# Patient Record
Sex: Female | Born: 1995
Health system: Southern US, Community
[De-identification: ages and names within clinical notes are randomized; demographics above are authoritative.]

---
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
## 2014-09-21 ENCOUNTER — Other Ambulatory Visit: Payer: Self-pay | Admitting: Family Medicine

## 2016-02-11 ENCOUNTER — Emergency Department
Admission: EM | Admit: 2016-02-11 | Discharge: 2016-02-11 | Disposition: A | Discharge status: Home / Self Care | Payer: BLUE CROSS/BLUE SHIELD | Source: Home / Self Care | Attending: Family Medicine | Admitting: Family Medicine

## 2016-02-11 ENCOUNTER — Encounter: Payer: Self-pay | Admitting: *Deleted

## 2016-02-11 ENCOUNTER — Emergency Department (INDEPENDENT_AMBULATORY_CARE_PROVIDER_SITE_OTHER): Payer: BLUE CROSS/BLUE SHIELD

## 2016-02-11 DIAGNOSIS — R05 Cough: Secondary | ICD-10-CM

## 2016-02-11 DIAGNOSIS — R053 Chronic cough: Secondary | ICD-10-CM

## 2016-02-11 MED ORDER — DOXYCYCLINE HYCLATE 100 MG PO CAPS: 100.0000 mg | ORAL_CAPSULE | Freq: Two times a day (BID) | ORAL | 0 refills | Status: AC

## 2016-02-11 MED ORDER — PREDNISONE 20 MG PO TABS: ORAL_TABLET | ORAL | 0 refills | Status: AC

## 2016-02-11 MED ORDER — FLUCONAZOLE 150 MG PO TABS: 150.0000 mg | ORAL_TABLET | Freq: Once | ORAL | 0 refills | Status: AC

## 2016-02-11 NOTE — ED Triage Notes (Signed)
Pt c/o productive cough and congestion x 2 mths. Denies fever. Has taken Mucinex.

## 2016-02-11 NOTE — ED Provider Notes (Signed)
[facility]    CSN: [id number] Arrival date & time: 02/11/16  1059     History   Chief Complaint Chief Complaint  Patient presents with  . Cough    HPI Leslie Bruce is a 20 y.o. female.   Patient complains of a persistent non-productive cough for about two months.  Her symptoms have become worse during the past week with hoarseness and increased nasal congestion.  She denies fever, sore throat, pleuritic pain. She has a family history of asthma (maternal grandmother and first cousin)   The history is provided by the patient and a parent.    History reviewed. No pertinent past medical history.  There are no active problems to display for this patient.   History reviewed. No pertinent surgical history.  OB History    No data available       Home Medications    Prior to Admission medications   Medication Sig Start Date End Date Taking? Authorizing Provider  FLUoxetine (PROZAC) 10 MG tablet Take 10 mg by mouth daily.   Yes Historical Provider, MD  doxycycline (VIBRAMYCIN) 100 MG capsule Take 1 capsule (100 mg total) by mouth 2 (two) times daily. Take with food. 02/11/16   Lattie HawStephen A Patrica Mendell, MD  fluconazole (DIFLUCAN) 150 MG tablet Take 1 tablet (150 mg total) by mouth once. May repeat in 72 hours. 02/11/16 02/11/16  Lattie HawStephen A Tuvia Woodrick, MD  norgestimate-ethinyl estradiol (ORTHO-CYCLEN,SPRINTEC,PREVIFEM) 0.25-35 MG-MCG tablet TAKE ONE TABLET BY MOUTH EVERY DAY 09/21/14   Agapito Gamesatherine D Metheney, MD  predniSONE (DELTASONE) 20 MG tablet Take one tab by mouth twice daily for 5 days, then one daily for 3 days. Take with food. 02/11/16   Lattie HawStephen A Amarachukwu Lakatos, MD    Family History Family History  Problem Relation Age of Onset  . Alcohol abuse Father   . Hypertension Father   . Asthma      Social History Social History  Substance Use Topics  . Smoking status: Current Some Day Smoker  . Smokeless tobacco: Never Used     Comment: social smoker  . Alcohol use Yes   Comment: 10 q wk     Allergies   Penicillins and Amoxicillin   Review of Systems Review of Systems  No sore throat + cough No pleuritic pain No wheezing + mild nasal congestion + post-nasal drainage No sinus pain/pressure No itchy/red eyes No earache No hemoptysis No SOB No fever/chills No nausea No vomiting No abdominal pain No diarrhea No urinary symptoms No skin rash + fatigue No myalgias No headache Used OTC meds without relief    Physical Exam Triage Vital Signs ED Triage Vitals  Enc Vitals Group     BP 02/11/16 1120 131/90     Pulse Rate 02/11/16 1120 82     Resp 02/11/16 1120 14     Temp 02/11/16 1120 97.9 F (36.6 C)     Temp Source 02/11/16 1120 Oral     SpO2 02/11/16 1120 99 %     Weight 02/11/16 1121 107 lb (48.5 kg)     Height --      Head Circumference --      Peak Flow --      Pain Score 02/11/16 1122 0     Pain Loc --      Pain Edu? --      Excl. in GC? --    No data found.   Updated Vital Signs BP 131/90 (BP Location: Left Arm)   Pulse 82  Temp 97.9 F (36.6 C) (Oral)   Resp 14   Wt 107 lb (48.5 kg)   LMP 02/10/2016   SpO2 99%   Visual Acuity Right Eye Distance:   Left Eye Distance:   Bilateral Distance:    Right Eye Near:   Left Eye Near:    Bilateral Near:     Physical Exam Nursing notes and Vital Signs reviewed. Appearance:  Patient appears stated age, and in no acute distress Eyes:  Pupils are equal, round, and reactive to light and accomodation.  Extraocular movement is intact.  Conjunctivae are not inflamed  Ears:  Canals normal.  Tympanic membranes normal.  Nose:  Mildly congested turbinates.  No sinus tenderness.    Pharynx:  Normal Neck:  Supple.  Tender enlarged posterior/lateral nodes are palpated bilaterally  Lungs:  Clear to auscultation.  Breath sounds are equal.  Moving air well. Heart:  Regular rate and rhythm without murmurs, rubs, or gallops.  Abdomen:  Nontender without masses or  hepatosplenomegaly.  Bowel sounds are present.  No CVA or flank tenderness.  Extremities:  No edema.  Skin:  No rash present.    UC Treatments / Results  Labs (all labs ordered are listed, but only abnormal results are displayed) Labs Reviewed - No data to display  EKG  EKG Interpretation None       Radiology Dg Chest 2 View  Result Date: 02/11/2016 CLINICAL DATA:  Persistent cough for 6 weeks. EXAM: CHEST  2 VIEW COMPARISON:  None. FINDINGS: The heart size and mediastinal contours are within normal limits. Both lungs are clear. No pneumothorax or pleural effusion is noted. The visualized skeletal structures are unremarkable. IMPRESSION: No active cardiopulmonary disease. Electronically Signed   By: Lupita RaiderJames  Green Jr, M.D.   On: 02/11/2016 12:19    Procedures Procedures (including critical care time)  Medications Ordered in UC Medications - No data to display   Initial Impression / Assessment and Plan / UC Course  I have reviewed the triage vital signs and the nursing notes.  Pertinent labs & imaging results that were available during my care of the patient were reviewed by me and considered in my medical decision making (see chart for details).  Clinical Course   With patient's history of prolonged cough and family history of asthma, suspect that she probably has a predisposition to mild reactive airways disease.  Begin doxycycline 100mg  BID for atypical coverage. Begin prednisone burst/taper. Rx for Diflucan at patient's request. Take plain guaifenesin (1200mg  extended release tabs such as Mucinex) twice daily, with plenty of water, for cough and congestion.  Get adequate rest.   May take Delsym Cough Suppressant at bedtime for nighttime cough.  Stop all antihistamines for now, and other non-prescription cough/cold preparations. May begin Diflucan for vaginal yeast infection. Follow-up with family doctor if not improving about10 days.      Final Clinical Impressions(s)  / UC Diagnoses   Final diagnoses:  Persistent cough    New Prescriptions New Prescriptions   DOXYCYCLINE (VIBRAMYCIN) 100 MG CAPSULE    Take 1 capsule (100 mg total) by mouth 2 (two) times daily. Take with food.   FLUCONAZOLE (DIFLUCAN) 150 MG TABLET    Take 1 tablet (150 mg total) by mouth once. May repeat in 72 hours.   PREDNISONE (DELTASONE) 20 MG TABLET    Take one tab by mouth twice daily for 5 days, then one daily for 3 days. Take with food.     Lattie HawStephen A Allison Silva, MD  02/14/16 0043  

## 2016-02-11 NOTE — Discharge Instructions (Signed)
Take plain guaifenesin (1200mg  extended release tabs such as Mucinex) twice daily, with plenty of water, for cough and congestion.  Get adequate rest.   May take Delsym Cough Suppressant at bedtime for nighttime cough.  Stop all antihistamines for now, and other non-prescription cough/cold preparations. May begin Diflucan for vaginal yeast infection. Follow-up with family doctor if not improving about10 days.

## 2016-08-11 ENCOUNTER — Other Ambulatory Visit (HOSPITAL_COMMUNITY)
Admission: RE | Admit: 2016-08-11 | Discharge: 2016-08-11 | Disposition: A | Discharge status: Home / Self Care | Payer: BLUE CROSS/BLUE SHIELD | Source: Ambulatory Visit | Attending: Obstetrics & Gynecology | Admitting: Obstetrics & Gynecology

## 2016-08-11 ENCOUNTER — Other Ambulatory Visit: Payer: Self-pay | Admitting: Obstetrics & Gynecology

## 2016-08-11 DIAGNOSIS — Z30431 Encounter for routine checking of intrauterine contraceptive device: Secondary | ICD-10-CM | POA: Diagnosis not present

## 2016-08-11 DIAGNOSIS — Z01411 Encounter for gynecological examination (general) (routine) with abnormal findings: Secondary | ICD-10-CM | POA: Diagnosis not present

## 2016-08-11 DIAGNOSIS — Z124 Encounter for screening for malignant neoplasm of cervix: Secondary | ICD-10-CM | POA: Insufficient documentation

## 2016-08-13 LAB — CYTOLOGY - PAP
Chlamydia: NEGATIVE
Diagnosis: NEGATIVE
NEISSERIA GONORRHEA: NEGATIVE

## 2016-10-27 DIAGNOSIS — Z681 Body mass index (BMI) 19 or less, adult: Secondary | ICD-10-CM | POA: Diagnosis not present

## 2016-10-27 DIAGNOSIS — M76812 Anterior tibial syndrome, left leg: Secondary | ICD-10-CM | POA: Diagnosis not present

## 2016-11-30 DIAGNOSIS — M549 Dorsalgia, unspecified: Secondary | ICD-10-CM | POA: Diagnosis not present

## 2016-11-30 DIAGNOSIS — J069 Acute upper respiratory infection, unspecified: Secondary | ICD-10-CM | POA: Diagnosis not present

## 2017-08-09 DIAGNOSIS — Z113 Encounter for screening for infections with a predominantly sexual mode of transmission: Secondary | ICD-10-CM | POA: Diagnosis not present

## 2017-08-09 DIAGNOSIS — F419 Anxiety disorder, unspecified: Secondary | ICD-10-CM | POA: Diagnosis not present

## 2017-08-09 DIAGNOSIS — Z01411 Encounter for gynecological examination (general) (routine) with abnormal findings: Secondary | ICD-10-CM | POA: Diagnosis not present

## 2017-08-09 DIAGNOSIS — Z8619 Personal history of other infectious and parasitic diseases: Secondary | ICD-10-CM | POA: Diagnosis not present

## 2017-08-09 DIAGNOSIS — Z975 Presence of (intrauterine) contraceptive device: Secondary | ICD-10-CM | POA: Diagnosis not present

## 2017-09-02 DIAGNOSIS — Z0000 Encounter for general adult medical examination without abnormal findings: Secondary | ICD-10-CM | POA: Diagnosis not present

## 2017-09-02 DIAGNOSIS — Z23 Encounter for immunization: Secondary | ICD-10-CM | POA: Diagnosis not present

## 2017-09-02 DIAGNOSIS — K580 Irritable bowel syndrome with diarrhea: Secondary | ICD-10-CM | POA: Diagnosis not present

## 2017-09-02 DIAGNOSIS — F419 Anxiety disorder, unspecified: Secondary | ICD-10-CM | POA: Diagnosis not present

## 2017-09-02 DIAGNOSIS — K649 Unspecified hemorrhoids: Secondary | ICD-10-CM | POA: Diagnosis not present

## 2017-09-06 DIAGNOSIS — M546 Pain in thoracic spine: Secondary | ICD-10-CM | POA: Diagnosis not present

## 2017-09-06 DIAGNOSIS — M25651 Stiffness of right hip, not elsewhere classified: Secondary | ICD-10-CM | POA: Diagnosis not present

## 2017-09-06 DIAGNOSIS — M25652 Stiffness of left hip, not elsewhere classified: Secondary | ICD-10-CM | POA: Diagnosis not present

## 2017-09-06 DIAGNOSIS — M545 Low back pain: Secondary | ICD-10-CM | POA: Diagnosis not present

## 2017-09-06 DIAGNOSIS — M25612 Stiffness of left shoulder, not elsewhere classified: Secondary | ICD-10-CM | POA: Diagnosis not present

## 2017-09-06 DIAGNOSIS — M25611 Stiffness of right shoulder, not elsewhere classified: Secondary | ICD-10-CM | POA: Diagnosis not present

## 2017-10-31 IMAGING — DX DG CHEST 2V
2 series · 2 of 2 positions shown · non-contrast
Comparison: None.

CLINICAL DATA: Persistent cough for 6 weeks.

EXAM:
CHEST  2 VIEW

[chest pa]
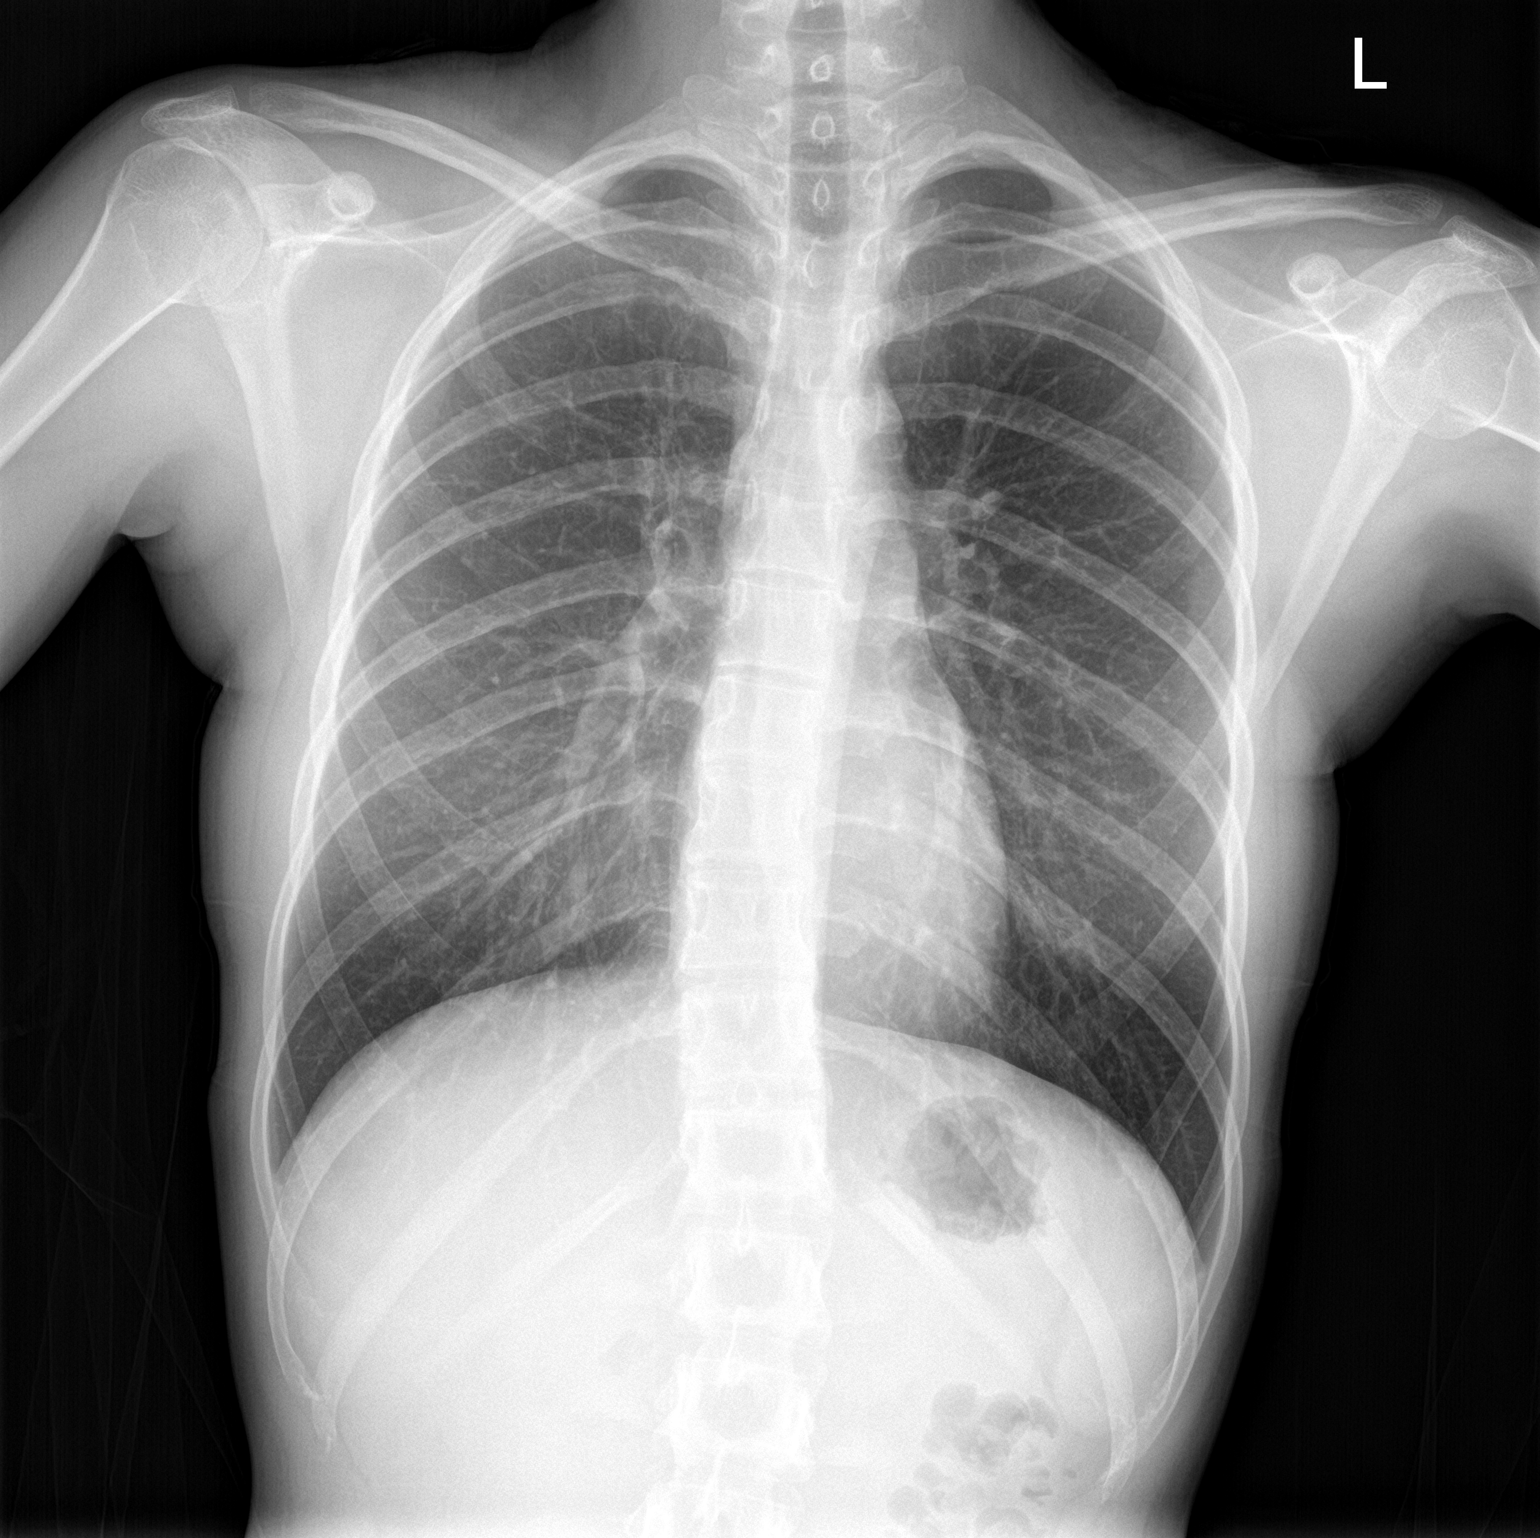

[chest lat]
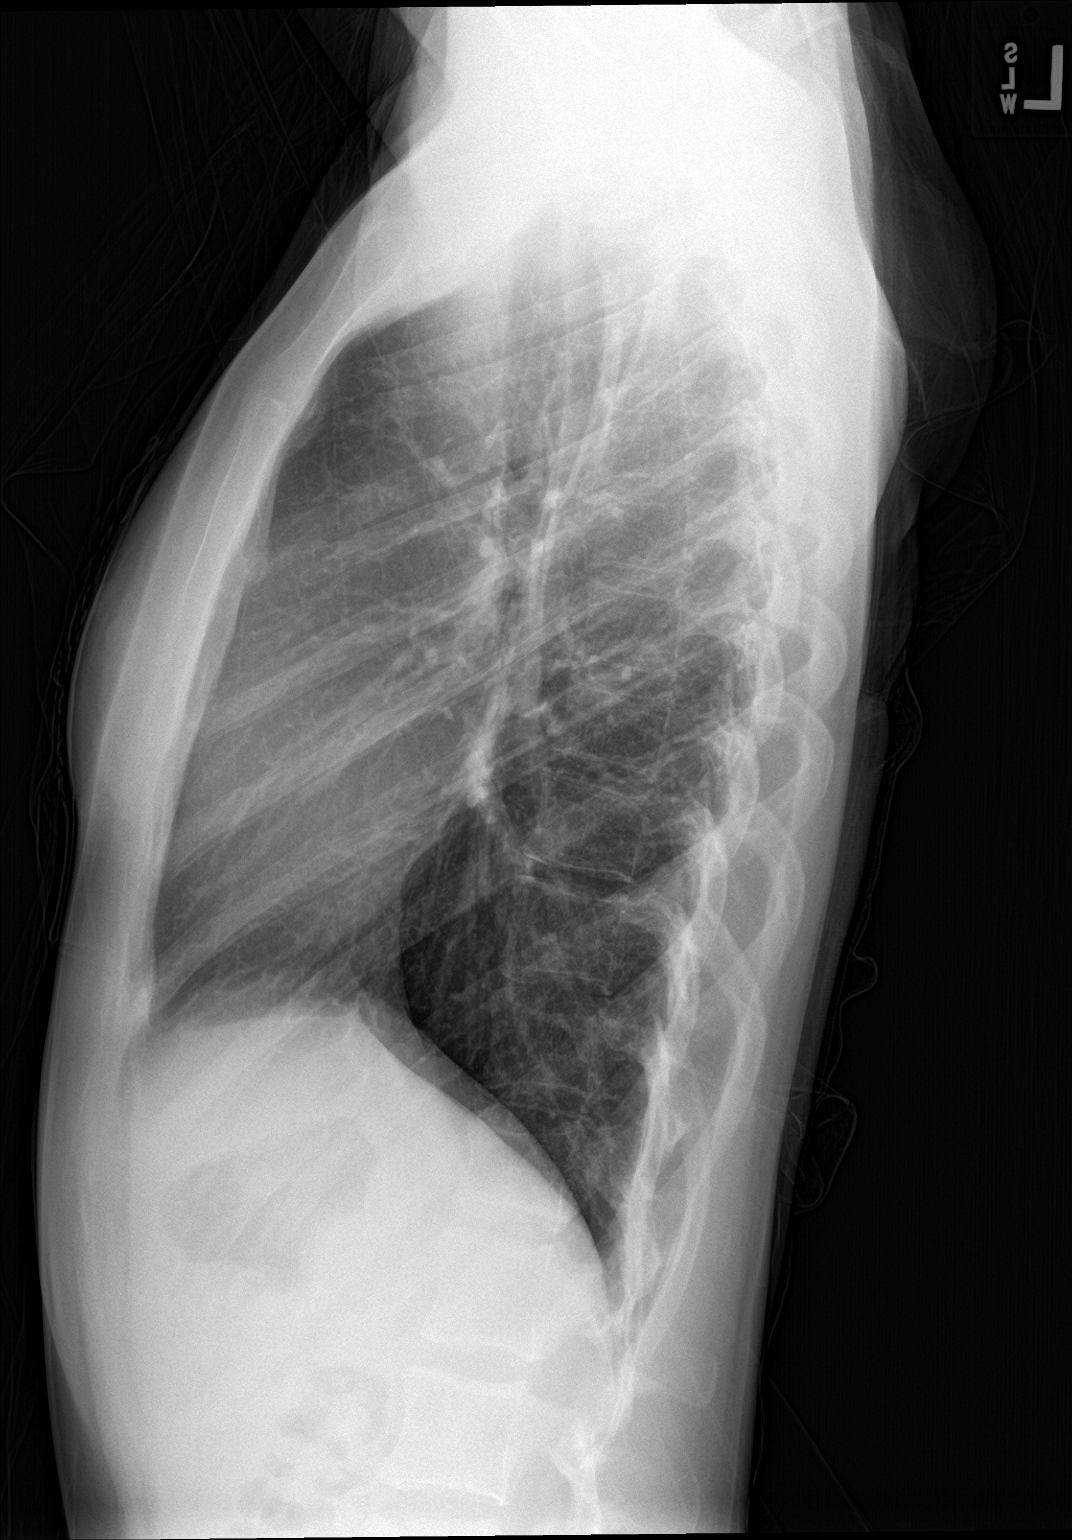

[2 of 2 positions shown; findings below may reference images not displayed]

FINDINGS: The heart size and mediastinal contours are within normal limits.
Both lungs are clear. No pneumothorax or pleural effusion is noted.
The visualized skeletal structures are unremarkable.
IMPRESSION: No active cardiopulmonary disease.

## 2017-12-01 DIAGNOSIS — Z113 Encounter for screening for infections with a predominantly sexual mode of transmission: Secondary | ICD-10-CM | POA: Diagnosis not present

## 2017-12-30 DIAGNOSIS — Z01818 Encounter for other preprocedural examination: Secondary | ICD-10-CM | POA: Diagnosis not present

## 2017-12-30 DIAGNOSIS — Z3009 Encounter for other general counseling and advice on contraception: Secondary | ICD-10-CM | POA: Diagnosis not present

## 2018-01-06 DIAGNOSIS — Z30432 Encounter for removal of intrauterine contraceptive device: Secondary | ICD-10-CM | POA: Diagnosis not present

## 2018-01-06 DIAGNOSIS — Z30430 Encounter for insertion of intrauterine contraceptive device: Secondary | ICD-10-CM | POA: Diagnosis not present

## 2018-04-07 DIAGNOSIS — Z0000 Encounter for general adult medical examination without abnormal findings: Secondary | ICD-10-CM | POA: Diagnosis not present

## 2018-04-07 DIAGNOSIS — F902 Attention-deficit hyperactivity disorder, combined type: Secondary | ICD-10-CM | POA: Diagnosis not present

## 2018-05-19 DIAGNOSIS — E559 Vitamin D deficiency, unspecified: Secondary | ICD-10-CM | POA: Diagnosis not present

## 2018-05-19 DIAGNOSIS — F902 Attention-deficit hyperactivity disorder, combined type: Secondary | ICD-10-CM | POA: Diagnosis not present

## 2018-11-23 DIAGNOSIS — F902 Attention-deficit hyperactivity disorder, combined type: Secondary | ICD-10-CM | POA: Diagnosis not present

## 2019-02-26 DIAGNOSIS — Z111 Encounter for screening for respiratory tuberculosis: Secondary | ICD-10-CM | POA: Diagnosis not present

## 2019-02-28 DIAGNOSIS — Z111 Encounter for screening for respiratory tuberculosis: Secondary | ICD-10-CM | POA: Diagnosis not present

## 2019-03-09 DIAGNOSIS — Z7689 Persons encountering health services in other specified circumstances: Secondary | ICD-10-CM | POA: Diagnosis not present

## 2019-03-09 DIAGNOSIS — F419 Anxiety disorder, unspecified: Secondary | ICD-10-CM | POA: Diagnosis not present

## 2019-03-09 DIAGNOSIS — F900 Attention-deficit hyperactivity disorder, predominantly inattentive type: Secondary | ICD-10-CM | POA: Diagnosis not present

## 2019-05-05 DIAGNOSIS — Z03818 Encounter for observation for suspected exposure to other biological agents ruled out: Secondary | ICD-10-CM | POA: Diagnosis not present
